# Patient Record
Sex: Male | Born: 1985 | Race: White | Hispanic: No | Marital: Single | Smoking: Current every day smoker
Health system: Southern US, Community
[De-identification: ages and names within clinical notes are randomized; demographics above are authoritative.]

## PROBLEM LIST (undated history)

## (undated) DIAGNOSIS — B019 Varicella without complication: Secondary | ICD-10-CM

## (undated) HISTORY — PX: NO PAST SURGERIES: SHX2092

## (undated) HISTORY — DX: Varicella without complication: B01.9

---

## 2016-01-26 ENCOUNTER — Ambulatory Visit: Payer: Self-pay

## 2016-05-10 ENCOUNTER — Ambulatory Visit (INDEPENDENT_AMBULATORY_CARE_PROVIDER_SITE_OTHER): Payer: BLUE CROSS/BLUE SHIELD | Admitting: Family Medicine

## 2016-05-10 ENCOUNTER — Encounter: Payer: Self-pay | Admitting: Family Medicine

## 2016-05-10 VITALS — BP 110/70 | HR 67 | Temp 98.0°F | Ht 72.0 in | Wt 171.0 lb

## 2016-05-10 DIAGNOSIS — Z23 Encounter for immunization: Secondary | ICD-10-CM | POA: Diagnosis not present

## 2016-05-10 DIAGNOSIS — Z0000 Encounter for general adult medical examination without abnormal findings: Secondary | ICD-10-CM | POA: Insufficient documentation

## 2016-05-10 LAB — CBC
HEMATOCRIT: 44.9 % (ref 39.0–52.0)
HEMOGLOBIN: 15.5 g/dL (ref 13.0–17.0)
MCHC: 34.5 g/dL (ref 30.0–36.0)
MCV: 93.1 fl (ref 78.0–100.0)
Platelets: 181 10*3/uL (ref 150.0–400.0)
RBC: 4.83 Mil/uL (ref 4.22–5.81)
RDW: 12.4 % (ref 11.5–15.5)
WBC: 5.2 10*3/uL (ref 4.0–10.5)

## 2016-05-10 LAB — COMPREHENSIVE METABOLIC PANEL
ALT: 11 U/L (ref 0–53)
AST: 17 U/L (ref 0–37)
Albumin: 4.7 g/dL (ref 3.5–5.2)
Alkaline Phosphatase: 42 U/L (ref 39–117)
BUN: 16 mg/dL (ref 6–23)
CHLORIDE: 104 meq/L (ref 96–112)
CO2: 28 meq/L (ref 19–32)
Calcium: 9.7 mg/dL (ref 8.4–10.5)
Creatinine, Ser: 1.12 mg/dL (ref 0.40–1.50)
GFR: 82.33 mL/min (ref >60.00)
GLUCOSE: 80 mg/dL (ref 70–99)
POTASSIUM: 4.2 meq/L (ref 3.5–5.1)
Sodium: 140 mEq/L (ref 135–145)
Total Bilirubin: 0.8 mg/dL (ref 0.2–1.2)
Total Protein: 6.7 g/dL (ref 6.0–8.3)

## 2016-05-10 LAB — LIPID PANEL
CHOL/HDL RATIO: 2
Cholesterol: 128 mg/dL (ref 0–200)
HDL: 56.2 mg/dL (ref >39.00)
LDL CALC: 53 mg/dL (ref 0–99)
NONHDL: 72.26
Triglycerides: 97 mg/dL (ref 0.0–149.0)
VLDL: 19.4 mg/dL (ref 0.0–40.0)

## 2016-05-10 LAB — HEMOGLOBIN A1C: Hgb A1c MFr Bld: 5.2 % (ref 4.6–6.5)

## 2016-05-10 NOTE — Progress Notes (Signed)
   Subjective:  Patient ID: Ronald Burns, male    DOB: 04-15-1986  Age: 30 y.o. MRN: [id number]  CC: Establish care/Annual physical exam.  HPI Ronald Burns is a 30 y.o. male presents to the clinic today to establish care and for an annual physical exam.  Preventative Healthcare  Immunizations  Tetanus - w/in last 10 years.   Flu - In need of.  Labs: Screening labs.  Alcohol use: Yes. See below.  Smoking/tobacco use: Vapes.  Regular dental exams: Yes.   PMH, Surgical Hx, Family Hx, Social History reviewed and updated as below.  Past Medical History:  Diagnosis Date  . Chicken pox    Past Surgical History:  Procedure Laterality Date  . NO PAST SURGERIES     Family History  Problem Relation Age of Onset  . Heart disease Father   . Diabetes Maternal Uncle   . Arthritis Maternal Grandmother   . Hypertension Maternal Grandmother   . Arthritis Maternal Grandfather   . Diabetes Maternal Grandfather   . Hypertension Maternal Grandfather   . Hypertension Paternal Grandmother   . Hypertension Paternal Grandfather    Social History  Substance Use Topics  . Smoking status: Current Every Day Smoker    Types: E-cigarettes  . Smokeless tobacco: Former Systems developer  . Alcohol use 3.6 oz/week    6 Standard drinks or equivalent per week    Review of Systems  Cardiovascular: Positive for palpitations.  Psychiatric/Behavioral:       Anxiety, stress.  All other systems reviewed and are negative.  Objective:   Today's Vitals: BP 110/70 (BP Location: Right Arm, Patient Position: Sitting, Cuff Size: Normal)   Pulse 67   Temp 98 F (36.7 C) (Oral)   Ht 6' (1.829 m)   Wt 171 lb (77.6 kg)   SpO2 100%   BMI 23.19 kg/m   Physical Exam  Constitutional: He is oriented to person, place, and time. He appears well-developed and well-nourished. No distress.  HENT:  Head: Normocephalic and atraumatic.  Nose: Nose normal.  Mouth/Throat: Oropharynx is clear and moist. No oropharyngeal  exudate.  Normal TM's bilaterally.   Eyes: Conjunctivae are normal. No scleral icterus.  Neck: Neck supple. No thyromegaly present.  Cardiovascular: Normal rate and regular rhythm.   No murmur heard. Pulmonary/Chest: Effort normal and breath sounds normal. He has no wheezes. He has no rales.  Abdominal: Soft. He exhibits no distension. There is no tenderness. There is no rebound and no guarding.  Musculoskeletal: Normal range of motion. He exhibits no edema.  Lymphadenopathy:    He has no cervical adenopathy.  Neurological: He is alert and oriented to person, place, and time.  Skin: Skin is warm and dry. No rash noted.  Psychiatric: He has a normal mood and affect.  Vitals reviewed.  Assessment & Plan:   Problem List Items Addressed This Visit    Annual physical exam - Primary    Tetanus up to date. Flu vaccine given today. Screening labs today.      Relevant Orders   CBC   Comp Met (CMET)   Lipid Profile   HgB A1c    Other Visit Diagnoses    Encounter for immunization       Relevant Orders   Flu Vaccine QUAD 36+ mos IM (Completed)     Follow-up: Return in about 1 year (around 05/10/2017).  [place]

## 2016-05-10 NOTE — Patient Instructions (Addendum)
It was nice to see you today.  Follow up:  No Follow-up on file.  Take care  Dr. Adriana Simasook   Health Maintenance, Male A healthy lifestyle and preventative care can promote health and wellness.  Maintain regular health, dental, and eye exams.  Eat a healthy diet. Foods like vegetables, fruits, whole grains, low-fat dairy products, and lean protein foods contain the nutrients you need and are low in calories. Decrease your intake of foods high in solid fats, added sugars, and salt. Get information about a proper diet from your health care provider, if necessary.  Regular physical exercise is one of the most important things you can do for your health. Most adults should get at least 150 minutes of moderate-intensity exercise (any activity that increases your heart rate and causes you to sweat) each week. In addition, most adults need muscle-strengthening exercises on 2 or more days a week.   Maintain a healthy weight. The body mass index (BMI) is a screening tool to identify possible weight problems. It provides an estimate of body fat based on height and weight. Your health care provider can find your BMI and can help you achieve or maintain a healthy weight. For males 20 years and older:  A BMI below 18.5 is considered underweight.  A BMI of 18.5 to 24.9 is normal.  A BMI of 25 to 29.9 is considered overweight.  A BMI of 30 and above is considered obese.  Maintain normal blood lipids and cholesterol by exercising and minimizing your intake of saturated fat. Eat a balanced diet with plenty of fruits and vegetables. Blood tests for lipids and cholesterol should begin at age 29 and be repeated every 5 years. If your lipid or cholesterol levels are high, you are over age 29, or you are at high risk for heart disease, you may need your cholesterol levels checked more frequently.Ongoing high lipid and cholesterol levels should be treated with medicines if diet and exercise are not working.  If  you smoke, find out from your health care provider how to quit. If you do not use tobacco, do not start.  Lung cancer screening is recommended for adults aged 55-80 years who are at high risk for developing lung cancer because of a history of smoking. A yearly low-dose CT scan of the lungs is recommended for people who have at least a 30-pack-year history of smoking and are current smokers or have quit within the past 15 years. A pack year of smoking is smoking an average of 1 pack of cigarettes a day for 1 year (for example, a 30-pack-year history of smoking could mean smoking 1 pack a day for 30 years or 2 packs a day for 15 years). Yearly screening should continue until the smoker has stopped smoking for at least 15 years. Yearly screening should be stopped for people who develop a health problem that would prevent them from having lung cancer treatment.  If you choose to drink alcohol, do not have more than 2 drinks per day. One drink is considered to be 12 oz (360 mL) of beer, 5 oz (150 mL) of wine, or 1.5 oz (45 mL) of liquor.  Avoid the use of street drugs. Do not share needles with anyone. Ask for help if you need support or instructions about stopping the use of drugs.  High blood pressure causes heart disease and increases the risk of stroke. High blood pressure is more likely to develop in:  People who have blood pressure in  the end of the normal range (100-139/85-89 mm Hg).  People who are overweight or obese.  People who are African American.  If you are 73-75 years of age, have your blood pressure checked every 3-5 years. If you are 89 years of age or older, have your blood pressure checked every year. You should have your blood pressure measured twice--once when you are at a hospital or clinic, and once when you are not at a hospital or clinic. Record the average of the two measurements. To check your blood pressure when you are not at a hospital or clinic, you can use:  An automated  blood pressure machine at a pharmacy.  A home blood pressure monitor.  If you are 40-18 years old, ask your health care provider if you should take aspirin to prevent heart disease.  Diabetes screening involves taking a blood sample to check your fasting blood sugar level. This should be done once every 3 years after age 28 if you are at a normal weight and without risk factors for diabetes. Testing should be considered at a younger age or be carried out more frequently if you are overweight and have at least 1 risk factor for diabetes.  Colorectal cancer can be detected and often prevented. Most routine colorectal cancer screening begins at the age of 27 and continues through age 51. However, your health care provider may recommend screening at an earlier age if you have risk factors for colon cancer. On a yearly basis, your health care provider may provide home test kits to check for hidden blood in the stool. A small camera at the end of a tube may be used to directly examine the colon (sigmoidoscopy or colonoscopy) to detect the earliest forms of colorectal cancer. Talk to your health care provider about this at age 95 when routine screening begins. A direct exam of the colon should be repeated every 5-10 years through age 61, unless early forms of precancerous polyps or small growths are found.  People who are at an increased risk for hepatitis B should be screened for this virus. You are considered at high risk for hepatitis B if:  You were born in a country where hepatitis B occurs often. Talk with your health care provider about which countries are considered high risk.  Your parents were born in a high-risk country and you have not received a shot to protect against hepatitis B (hepatitis B vaccine).  You have HIV or AIDS.  You use needles to inject street drugs.  You live with, or have sex with, someone who has hepatitis B.  You are a man who has sex with other men (MSM).  You get  hemodialysis treatment.  You take certain medicines for conditions like cancer, organ transplantation, and autoimmune conditions.  Hepatitis C blood testing is recommended for all people born from [birth year] through 1964 and any individual with known risk factors for hepatitis C.  Healthy men should no longer receive prostate-specific antigen (PSA) blood tests as part of routine cancer screening. Talk to your health care provider about prostate cancer screening.  Testicular cancer screening is not recommended for adolescents or adult males who have no symptoms. Screening includes self-exam, a health care provider exam, and other screening tests. Consult with your health care provider about any symptoms you have or any concerns you have about testicular cancer.  Practice safe sex. Use condoms and avoid high-risk sexual practices to reduce the spread of sexually transmitted infections (STIs).  You  should be screened for STIs, including gonorrhea and chlamydia if:  You are sexually active and are younger than 24 years.  You are older than 24 years, and your health care provider tells you that you are at risk for this type of infection.  Your sexual activity has changed since you were last screened, and you are at an increased risk for chlamydia or gonorrhea. Ask your health care provider if you are at risk.  If you are at risk of being infected with HIV, it is recommended that you take a prescription medicine daily to prevent HIV infection. This is called pre-exposure prophylaxis (PrEP). You are considered at risk if:  You are a man who has sex with other men (MSM).  You are a heterosexual man who is sexually active with multiple partners.  You take drugs by injection.  You are sexually active with a partner who has HIV.  Talk with your health care provider about whether you are at high risk of being infected with HIV. If you choose to begin PrEP, you should first be tested for HIV. You should  then be tested every 3 months for as long as you are taking PrEP.  Use sunscreen. Apply sunscreen liberally and repeatedly throughout the day. You should seek shade when your shadow is shorter than you. Protect yourself by wearing long sleeves, pants, a wide-brimmed hat, and sunglasses year round whenever you are outdoors.  Tell your health care provider of new moles or changes in moles, especially if there is a change in shape or color. Also, tell your health care provider if a mole is larger than the size of a pencil eraser.  A one-time screening for abdominal aortic aneurysm (AAA) and surgical repair of large AAAs by ultrasound is recommended for men aged 34-75 years who are current or former smokers.  Stay current with your vaccines (immunizations).   This information is not intended to replace advice given to you by your health care provider. Make sure you discuss any questions you have with your health care provider.   Document Released: 12/29/2007 Document Revised: 07/23/2014 Document Reviewed: 11/27/2010 Elsevier Interactive Patient Education [employer].

## 2016-05-10 NOTE — Progress Notes (Signed)
Pre visit review using our clinic review tool, if applicable. No additional management support is needed unless otherwise documented below in the visit note. 

## 2016-05-10 NOTE — Assessment & Plan Note (Signed)
Tetanus up to date. Flu vaccine given today. Screening labs today.

## 2016-06-14 ENCOUNTER — Encounter: Payer: Self-pay | Admitting: Emergency Medicine

## 2016-06-14 ENCOUNTER — Emergency Department
Admission: EM | Admit: 2016-06-14 | Discharge: 2016-06-14 | Disposition: A | Discharge status: Home / Self Care | Payer: BLUE CROSS/BLUE SHIELD | Attending: Emergency Medicine | Admitting: Emergency Medicine

## 2016-06-14 DIAGNOSIS — F17210 Nicotine dependence, cigarettes, uncomplicated: Secondary | ICD-10-CM | POA: Diagnosis not present

## 2016-06-14 DIAGNOSIS — R197 Diarrhea, unspecified: Secondary | ICD-10-CM | POA: Diagnosis not present

## 2016-06-14 LAB — COMPREHENSIVE METABOLIC PANEL
ALT: 33 U/L (ref 17–63)
AST: 29 U/L (ref 15–41)
Albumin: 3.8 g/dL (ref 3.5–5.0)
Alkaline Phosphatase: 44 U/L (ref 38–126)
Anion gap: 7 (ref 5–15)
BUN: 11 mg/dL (ref 6–20)
CO2: 26 mmol/L (ref 22–32)
CREATININE: 1.11 mg/dL (ref 0.61–1.24)
Calcium: 9 mg/dL (ref 8.9–10.3)
Chloride: 106 mmol/L (ref 101–111)
GFR calc non Af Amer: >60 mL/min (ref >60)
Glucose, Bld: 110 mg/dL — ABNORMAL HIGH (ref 65–99)
POTASSIUM: 3.4 mmol/L — AB (ref 3.5–5.1)
SODIUM: 139 mmol/L (ref 135–145)
Total Bilirubin: 0.8 mg/dL (ref 0.3–1.2)
Total Protein: 6.4 g/dL — ABNORMAL LOW (ref 6.5–8.1)

## 2016-06-14 LAB — CBC
HCT: 44.4 % (ref 40.0–52.0)
Hemoglobin: 15.5 g/dL (ref 13.0–18.0)
MCH: 32.1 pg (ref 26.0–34.0)
MCHC: 34.9 g/dL (ref 32.0–36.0)
MCV: 91.9 fL (ref 80.0–100.0)
PLATELETS: 150 10*3/uL (ref 150–440)
RBC: 4.83 MIL/uL (ref 4.40–5.90)
RDW: 12.3 % (ref 11.5–14.5)
WBC: 5.3 10*3/uL (ref 3.8–10.6)

## 2016-06-14 LAB — URINALYSIS COMPLETE WITH MICROSCOPIC (ARMC ONLY)
BILIRUBIN URINE: NEGATIVE
Bacteria, UA: NONE SEEN
Glucose, UA: NEGATIVE mg/dL
HGB URINE DIPSTICK: NEGATIVE
KETONES UR: NEGATIVE mg/dL
LEUKOCYTES UA: NEGATIVE
NITRITE: NEGATIVE
PH: 7 (ref 5.0–8.0)
Protein, ur: NEGATIVE mg/dL
SPECIFIC GRAVITY, URINE: 1.008 (ref 1.005–1.030)

## 2016-06-14 LAB — C DIFFICILE QUICK SCREEN W PCR REFLEX
C DIFFICILE (CDIFF) INTERP: NOT DETECTED
C Diff antigen: NEGATIVE
C Diff toxin: NEGATIVE

## 2016-06-14 LAB — GASTROINTESTINAL PANEL BY PCR, STOOL (REPLACES STOOL CULTURE)
Adenovirus F40/41: NOT DETECTED
Astrovirus: DETECTED — AB
CRYPTOSPORIDIUM: NOT DETECTED
CYCLOSPORA CAYETANENSIS: NOT DETECTED
Campylobacter species: NOT DETECTED
ENTEROAGGREGATIVE E COLI (EAEC): NOT DETECTED
Entamoeba histolytica: NOT DETECTED
Enteropathogenic E coli (EPEC): NOT DETECTED
Enterotoxigenic E coli (ETEC): NOT DETECTED
GIARDIA LAMBLIA: NOT DETECTED
Norovirus GI/GII: NOT DETECTED
PLESIMONAS SHIGELLOIDES: NOT DETECTED
ROTAVIRUS A: NOT DETECTED
SALMONELLA SPECIES: NOT DETECTED
SHIGELLA/ENTEROINVASIVE E COLI (EIEC): NOT DETECTED
Sapovirus (I, II, IV, and V): NOT DETECTED
Shiga like toxin producing E coli (STEC): NOT DETECTED
Vibrio cholerae: NOT DETECTED
Vibrio species: NOT DETECTED
YERSINIA ENTEROCOLITICA: NOT DETECTED

## 2016-06-14 LAB — LIPASE, BLOOD: LIPASE: 26 U/L (ref 11–51)

## 2016-06-14 NOTE — ED Provider Notes (Signed)
Patient received in signout from Dr. Dolores FrameSung pending stool studies. Presentation most consistent with viral enteritis. Patient remains he medically stable. Patient provided reassert pins and follow-up with PCP.  Patient was able to tolerate PO and was able to ambulate with a steady gait.  Have discussed with the patient and available family all diagnostics and treatments performed thus far and all questions were answered to the best of my ability. The patient demonstrates understanding and agreement with plan.    Willy EddyPatrick Sharetta Ricchio, MD 06/14/16 [phone]

## 2016-06-14 NOTE — Discharge Instructions (Signed)
1. BRAT diet 3 days, then slowly advance diet as tolerated. 2. Return to the ER for worsening symptoms, persistent vomiting, fever or other concerns.

## 2016-06-14 NOTE — ED Provider Notes (Signed)
[facility] Emergency Department Provider Note   ____________________________________________   First MD Initiated Contact with Patient 06/14/16 [phone]     (approximate)  I have reviewed the triage vital signs and the nursing notes.   HISTORY  Chief Complaint Diarrhea    HPI Ronald Burns is a 30 y.o. male who presents to the ED from home with a chief complaint of diarrhea. Patient reports a 2 day history of loose and watery stools. Reports approximately 3-5 loose stools daily. Symptoms are associated with abdominal cramping. Denies associated fever, chills, chest pain, shortness of breath, nausea, vomiting, dysuria. Denies recent travel or trauma. He thinks he may have eaten some bad food. Nothing makes his symptoms better or worse.   Past Medical History:  Diagnosis Date  . Chicken pox     Patient Active Problem List   Diagnosis Date Noted  . Annual physical exam 05/10/2016    Past Surgical History:  Procedure Laterality Date  . NO PAST SURGERIES      Prior to Admission medications   Not on File    Allergies Patient has no known allergies.  Family History  Problem Relation Age of Onset  . Heart disease Father   . Diabetes Maternal Uncle   . Arthritis Maternal Grandmother   . Hypertension Maternal Grandmother   . Arthritis Maternal Grandfather   . Diabetes Maternal Grandfather   . Hypertension Maternal Grandfather   . Hypertension Paternal Grandmother   . Hypertension Paternal Grandfather     Social History Social History  Substance Use Topics  . Smoking status: Current Every Day Smoker    Types: E-cigarettes  . Smokeless tobacco: Former NeurosurgeonUser  . Alcohol use 3.6 oz/week    6 Standard drinks or equivalent per week    Review of Systems  Constitutional: No fever/chills. Eyes: No visual changes. ENT: No sore throat. Cardiovascular: Denies chest pain. Respiratory: Denies shortness of breath. Gastrointestinal: No abdominal  pain.  No nausea, no vomiting.  Positive for diarrhea.  No constipation. Genitourinary: Negative for dysuria. Musculoskeletal: Negative for back pain. Skin: Negative for rash. Neurological: Negative for headaches, focal weakness or numbness.  10-point ROS otherwise negative.  ____________________________________________   PHYSICAL EXAM:  VITAL SIGNS: ED Triage Vitals  Enc Vitals Group     BP 06/14/16 0349 137/63     Pulse Rate 06/14/16 0349 76     Resp 06/14/16 0349 18     Temp 06/14/16 0349 98.2 F (36.8 C)     Temp Source 06/14/16 0349 Oral     SpO2 06/14/16 0349 100 %     Weight 06/14/16 0350 178 lb (80.7 kg)     Height 06/14/16 0350 6' (1.829 m)     Head Circumference --      Peak Flow --      Pain Score 06/14/16 0349 4     Pain Loc --      Pain Edu? --      Excl. in GC? --     Constitutional: Alert and oriented. Well appearing and in no acute distress. Eyes: Conjunctivae are normal. PERRL. EOMI. Head: Atraumatic. Nose: No congestion/rhinnorhea. Mouth/Throat: Mucous membranes are moist.  Oropharynx non-erythematous. Neck: No stridor.   Cardiovascular: Normal rate, regular rhythm. Grossly normal heart sounds.  Good peripheral circulation. Respiratory: Normal respiratory effort.  No retractions. Lungs CTAB. Gastrointestinal: Soft and nontender to light and deep palpation. No distention. No abdominal bruits. No CVA tenderness. Musculoskeletal: No lower extremity tenderness nor edema.  No joint effusions. Neurologic:  Normal speech and language. No gross focal neurologic deficits are appreciated. No gait instability. Skin:  Skin is warm, dry and intact. No rash noted. Psychiatric: Mood and affect are normal. Speech and behavior are normal.  ____________________________________________   LABS (all labs ordered are listed, but only abnormal results are displayed)  Labs Reviewed  COMPREHENSIVE METABOLIC PANEL - Abnormal; Notable for the following:       Result Value    Potassium 3.4 (*)    Glucose, Bld 110 (*)    Total Protein 6.4 (*)    All other components within normal limits  URINALYSIS COMPLETEWITH MICROSCOPIC ([facility] ONLY) - Abnormal; Notable for the following:    Color, Urine STRAW (*)    APPearance CLEAR (*)    Squamous Epithelial / LPF 0-5 (*)    All other components within normal limits  C DIFFICILE QUICK SCREEN W PCR REFLEX  GASTROINTESTINAL PANEL BY PCR, STOOL (REPLACES STOOL CULTURE)  LIPASE, BLOOD  CBC   ____________________________________________  EKG  None ____________________________________________  RADIOLOGY  None ____________________________________________   PROCEDURES  Procedure(s) performed: None  Procedures  Critical Care performed: No  ____________________________________________   INITIAL IMPRESSION / ASSESSMENT AND PLAN / ED COURSE  Pertinent labs & imaging results that were available during my care of the patient were reviewed by me and considered in my medical decision making (see chart for details).  30 year old male who presents with a 2 day history of diarrhea. Laboratory urinalysis results are unremarkable. Patient had loose watery stool in the treatment room prior to my exam. Will obtain stool cultures if patient can produce. Clinically patient is well-appearing with a benign abdominal exam; will hold CT scan for now.  Clinical Course as of Jun 15 711  Thu Jun 14, 2016  [id number] Stool cultures are pending. Care transferred to Dr. Roxan Hockeyobinson pending results.  [JS]    Clinical Course User Index [JS] Irean HongJade J Sung, MD     ____________________________________________   FINAL CLINICAL IMPRESSION(S) / ED DIAGNOSES  Final diagnoses:  Diarrhea, unspecified type      NEW MEDICATIONS STARTED DURING THIS VISIT:  New Prescriptions   No medications on file     Note:  This document was prepared using Dragon voice recognition software and may include unintentional dictation errors.    Irean HongJade  J Sung, MD 06/14/16 [phone]

## 2016-06-14 NOTE — ED Triage Notes (Signed)
Patient ambulatory to triage with steady gait, without difficulty or distress noted; pt reports watery stools x 2 days with abd cramping and chills

## 2017-02-18 ENCOUNTER — Encounter: Payer: Self-pay | Admitting: Family Medicine

## 2017-05-13 ENCOUNTER — Encounter: Payer: BLUE CROSS/BLUE SHIELD | Admitting: Family Medicine

## 2017-07-14 DIAGNOSIS — M545 Low back pain: Secondary | ICD-10-CM | POA: Diagnosis not present

## 2018-12-16 DIAGNOSIS — M545 Low back pain: Secondary | ICD-10-CM | POA: Diagnosis not present

## 2018-12-22 ENCOUNTER — Encounter: Payer: Self-pay | Admitting: Family Medicine

## 2018-12-22 ENCOUNTER — Ambulatory Visit (INDEPENDENT_AMBULATORY_CARE_PROVIDER_SITE_OTHER): Payer: BC Managed Care – PPO

## 2018-12-22 ENCOUNTER — Ambulatory Visit (INDEPENDENT_AMBULATORY_CARE_PROVIDER_SITE_OTHER): Payer: BC Managed Care – PPO | Admitting: Family Medicine

## 2018-12-22 ENCOUNTER — Other Ambulatory Visit: Payer: Self-pay

## 2018-12-22 VITALS — BP 100/62 | HR 77 | Temp 97.7°F | Resp 16 | Ht 72.0 in | Wt 194.8 lb

## 2018-12-22 DIAGNOSIS — M5442 Lumbago with sciatica, left side: Secondary | ICD-10-CM

## 2018-12-22 DIAGNOSIS — M62830 Muscle spasm of back: Secondary | ICD-10-CM | POA: Diagnosis not present

## 2018-12-22 DIAGNOSIS — G8929 Other chronic pain: Secondary | ICD-10-CM | POA: Diagnosis not present

## 2018-12-22 DIAGNOSIS — M4186 Other forms of scoliosis, lumbar region: Secondary | ICD-10-CM | POA: Diagnosis not present

## 2018-12-22 MED ORDER — CYCLOBENZAPRINE HCL 10 MG PO TABS: 10.0000 mg | ORAL_TABLET | Freq: Three times a day (TID) | ORAL | 0 refills | Status: AC | PRN

## 2018-12-22 MED ORDER — PREDNISONE 10 MG (21) PO TBPK: ORAL_TABLET | ORAL | 0 refills | Status: AC

## 2018-12-22 NOTE — Patient Instructions (Signed)
Back Exercises If you have pain in your back, do these exercises 2-3 times each day or as told by your doctor. When the pain goes away, do the exercises once each day, but repeat the steps more times for each exercise (do more repetitions). If you do not have pain in your back, do these exercises once each day or as told by your doctor. Exercises Single Knee to Chest Do these steps 3-5 times in a row for each leg: 1. Lie on your back on a firm bed or the floor with your legs stretched out. 2. Bring one knee to your chest. 3. Hold your knee to your chest by grabbing your knee or thigh. 4. Pull on your knee until you feel a gentle stretch in your lower back. 5. Keep doing the stretch for 10-30 seconds. 6. Slowly let go of your leg and straighten it. Pelvic Tilt Do these steps 5-10 times in a row: 1. Lie on your back on a firm bed or the floor with your legs stretched out. 2. Bend your knees so they point up to the ceiling. Your feet should be flat on the floor. 3. Tighten your lower belly (abdomen) muscles to press your lower back against the floor. This will make your tailbone point up to the ceiling instead of pointing down to your feet or the floor. 4. Stay in this position for 5-10 seconds while you gently tighten your muscles and breathe evenly. Cat-Cow Do these steps until your lower back bends more easily: 1. Get on your hands and knees on a firm surface. Keep your hands under your shoulders, and keep your knees under your hips. You may put padding under your knees. 2. Let your head hang down, and make your tailbone point down to the floor so your lower back is round like the back of a cat. 3. Stay in this position for 5 seconds. 4. Slowly lift your head and make your tailbone point up to the ceiling so your back hangs low (sags) like the back of a cow. 5. Stay in this position for 5 seconds.  Press-Ups Do these steps 5-10 times in a row: 1. Lie on your belly (face-down) on the floor.  2. Place your hands near your head, about shoulder-width apart. 3. While you keep your back relaxed and keep your hips on the floor, slowly straighten your arms to raise the top half of your body and lift your shoulders. Do not use your back muscles. To make yourself more comfortable, you may change where you place your hands. 4. Stay in this position for 5 seconds. 5. Slowly return to lying flat on the floor.  Bridges Do these steps 10 times in a row: 1. Lie on your back on a firm surface. 2. Bend your knees so they point up to the ceiling. Your feet should be flat on the floor. 3. Tighten your butt muscles and lift your butt off of the floor until your waist is almost as high as your knees. If you do not feel the muscles working in your butt and the back of your thighs, slide your feet 1-2 inches farther away from your butt. 4. Stay in this position for 3-5 seconds. 5. Slowly lower your butt to the floor, and let your butt muscles relax. If this exercise is too easy, try doing it with your arms crossed over your chest. Belly Crunches Do these steps 5-10 times in a row: 1. Lie on your back on a  firm bed or the floor with your legs stretched out. 2. Bend your knees so they point up to the ceiling. Your feet should be flat on the floor. 3. Cross your arms over your chest. 4. Tip your chin a little bit toward your chest but do not bend your neck. 5. Tighten your belly muscles and slowly raise your chest just enough to lift your shoulder blades a tiny bit off of the floor. 6. Slowly lower your chest and your head to the floor. Back Lifts Do these steps 5-10 times in a row: 1. Lie on your belly (face-down) with your arms at your sides, and rest your forehead on the floor. 2. Tighten the muscles in your legs and your butt. 3. Slowly lift your chest off of the floor while you keep your hips on the floor. Keep the back of your head in line with the curve in your back. Look at the floor while you  do this. 4. Stay in this position for 3-5 seconds. 5. Slowly lower your chest and your face to the floor. Contact a doctor if:  Your back pain gets a lot worse when you do an exercise.  Your back pain does not lessen 2 hours after you exercise. If you have any of these problems, stop doing the exercises. Do not do them again unless your doctor says it is okay. Get help right away if:  You have sudden, very bad back pain. If this happens, stop doing the exercises. Do not do them again unless your doctor says it is okay. This information is not intended to replace advice given to you by your health care provider. Make sure you discuss any questions you have with your health care provider. Document Released: 08/04/2010 Document Revised: 03/26/2018 Document Reviewed: 08/26/2014 Elsevier Interactive Patient Education  2019 [employer]. Core Strength Exercises  Core exercises help to build strength in the muscles between your ribs and your hips (abdominal muscles). These muscles help to support your body and keep your spine stable. It is important to maintain strength in your core to prevent injury and pain. Some activities, such as yoga and Pilates, can help to strengthen core muscles. You can also strengthen core muscles with exercises at home. It is important to talk to your health care provider before you start a new exercise routine. What are the benefits of core strength exercises? Core strength exercises can:  Reduce back pain.  Help to rebuild strength after a back or spine injury.  Help to prevent injury during physical activity, especially injuries to the back and knees. How to do core strength exercises Repeat these exercises 10-15 times, or until you are tired. Do exercises exactly as told by your health care provider and adjust them as directed. It is normal to feel mild stretching, pulling, tightness, or discomfort as you do these exercises. If you feel any pain while doing  these exercises, stop. If your pain continues or gets worse when doing core exercises, contact your health care provider. You may want to use a padded yoga or exercise mat for strength exercises that are done on the floor. Bridging  5. Lie on your back on a firm surface with your knees bent and your feet flat on the floor. 6. Raise your hips so that your knees, hips, and shoulders form a straight line together. Keep your abdominal muscles tight. 7. Hold this position for 3-5 seconds. 8. Slowly lower your hips to the starting position. 9. Let your  muscles relax completely between repetitions. Single-leg bridge 6. Lie on your back on a firm surface with your knees bent and your feet flat on the floor. 7. Raise your hips so that your knees, hips, and shoulders form a straight line together. Keep your abdominal muscles tight. 8. Lift one foot off the floor, then completely straighten that leg. 9. Hold this position for 3-5 seconds. 10. Put the straight leg back down in the bent position. 11. Slowly lower your hips to the starting position. 12. Repeat these steps using your other leg. Side bridge 6. Lie on your side with your knees bent. Prop yourself up on the elbow that is near the floor. 7. Using your abdominal muscles and your elbow that is on the floor, raise your body off the floor. Raise your hip so that your shoulder, hip, and foot form a straight line together. 8. Hold this position for 10 seconds. Keep your head and neck raised and away from your shoulder (in their normal, neutral position). Keep your abdominal muscles tight. 9. Slowly lower your hip to the starting position. 10. Repeat and try to hold this position longer, working your way up to 30 seconds. Abdominal crunch 6. Lie on your back on a firm surface. Bend your knees and keep your feet flat on the floor. 7. Cross your arms over your chest. 8. Without bending your neck, tip your chin slightly toward your chest. 9. Tighten  your abdominal muscles as you lift your chest just high enough to lift your shoulder blades off of the floor. Do not hold your breath. You can do this with short lifts or long lifts. 10. Slowly return to the starting position. Bird dog 7. Get on your hands and knees, with your legs shoulder-width apart and your arms under your shoulders. Keep your back straight. 8. Tighten your abdominal muscles. 9. Raise one of your legs off the floor and straighten it. Try to keep it parallel to the floor. 10. Slowly lower your leg to the starting position. 11. Raise one of your arms off the floor and straighten it. Try to keep it parallel to the floor. 12. Slowly lower your arm to the starting position. 13. Repeat with the other arm and leg. If possible, try raising a leg and arm at the same time, on opposite sides of the body. For example, raise your left hand and your right leg. Plank 6. Lie on your belly. 7. Prop up your body onto your forearms and your feet, keeping your legs straight. Your body should make a straight line between your shoulders and feet. 8. Hold this position for 10 seconds while keeping your abdominal muscles tight. 9. Lower your body to the starting position. 10. Repeat and try to hold this position longer, working your way up to 30 seconds. Cross-core strengthening 1. Stand with your feet shoulder-width apart. 2. Hold a ball out in front of you. Keep your arms straight. 3. Tighten your abdominal muscles and slowly rotate at your waist from side to side. Keep your feet flat. 4. Once you are comfortable, try repeating this exercise with a heavier ball. Top core strengthening 1. Stand about 18 inches (46 cm) in front of a wall, with your back to the wall. 2. Keep your feet flat and shoulder-width apart. 3. Tighten your abdominal muscles. 4. Bend your hips and knees. 5. Slowly reach between your legs to touch the wall behind you. 6. Slowly stand back up. 7. Raise your arms over  your head and reach behind you. 8. Return to the starting position. General tips  Do not do any exercises that cause pain. If you have pain while exercising, talk to your health care provider.  Always stretch before and after doing these exercises. This can help prevent injury.  Maintain a healthy weight. Ask your health care provider what weight is healthy for you. Contact a health care provider if:  You have back pain that gets worse or does not go away.  You feel pain while doing core strength exercises. Get help right away if:  You have severe pain that does not get better with medicine. Summary  Core exercises help to build strength in the muscles between your ribs and your waist.  Core muscles help to support your body and keep your spine stable.  Some activities, such as yoga and Pilates, can help to strengthen core muscles.  Core strength exercises can help back pain and can prevent injury.  If you feel any pain while doing core strength exercises, stop. This information is not intended to replace advice given to you by your health care provider. Make sure you discuss any questions you have with your health care provider. Document Released: 11/21/2016 Document Revised: 11/21/2016 Document Reviewed: 11/21/2016 Elsevier Interactive Patient Education  2019 [employer]  

## 2018-12-22 NOTE — Progress Notes (Signed)
Subjective:    Patient ID: Ronald Burns, male    DOB: 1986-02-23, 33 y.o.   MRN: [id number]  HPI   Patient presents to clinic due to flareup in low back pain.  Patient has been dealing with back pain off and on for 6 or 7 years.  Patient's job does require a lot of lifting and wonders if that puts a strain on his back.  He has been wearing a back brace as a preventative measure to help him at work and always tries to lift with his legs and use the tools available to him at his job to lift heavy items so he does not strain back.  Patient went to the urgent care about a month ago due to flareup of back pain and was given a IM injection of steroid.  States the injection helped for couple of days, but pain slowly returned.  States at times his left leg will feel like it is asleep.  Denies saddle anesthesia denies loss of bowel or bladder control.  States when he sits in 1 position for extended period of time he notices pain begin to build, also noticed pain a lot when he rode a horse and could not tolerate sitting on horse for long period of time.  Patient has never had imaging of low back.  Patient Active Problem List   Diagnosis Date Noted  . Annual physical exam 05/10/2016   Social History   Tobacco Use  . Smoking status: Current Every Day Smoker    Types: E-cigarettes  . Smokeless tobacco: Former Network engineer Use Topics  . Alcohol use: Yes    Alcohol/week: 6.0 standard drinks    Types: 6 Standard drinks or equivalent per week   Review of Systems  Constitutional: Negative for chills, fatigue and fever.  HENT: Negative for congestion, ear pain, sinus pain and sore throat.   Eyes: Negative.   Respiratory: Negative for cough, shortness of breath and wheezing.   Cardiovascular: Negative for chest pain, palpitations and leg swelling.  Gastrointestinal: Negative for abdominal pain, diarrhea, nausea and vomiting.  Genitourinary: Negative for dysuria, frequency and urgency.   Musculoskeletal: +low back pain Skin: Negative for color change, pallor and rash.  Neurological: Negative for syncope, light-headedness and headaches.  Psychiatric/Behavioral: The patient is not nervous/anxious.       Objective:   Physical Exam Vitals signs and nursing note reviewed.  Constitutional:      General: He is not in acute distress.    Appearance: He is not toxic-appearing.  HENT:     Head: Normocephalic and atraumatic.  Eyes:     General: No scleral icterus.    Extraocular Movements: Extraocular movements intact.     Pupils: Pupils are equal, round, and reactive to light.  Neck:     Musculoskeletal: Normal range of motion and neck supple. No neck rigidity.  Cardiovascular:     Rate and Rhythm: Normal rate and regular rhythm.     Heart sounds: Normal heart sounds.  Pulmonary:     Effort: Pulmonary effort is normal. No respiratory distress.     Breath sounds: Normal breath sounds.  Musculoskeletal:     Lumbar back: He exhibits tenderness.       Back:     Right lower leg: No edema.     Left lower leg: No edema.     Comments: Generalized tenderness across entire low back.  Left side more so than right.  Feels pain and pulling with straight  leg raises, left side more than right.  Skin:    General: Skin is warm and dry.     Coloration: Skin is not jaundiced or pale.  Neurological:     Mental Status: He is alert and oriented to person, place, and time.  Psychiatric:        Mood and Affect: Mood normal.        Behavior: Behavior normal.    Vitals:   12/22/18 1120  BP: 100/62  Pulse: 77  Resp: 16  Temp: 97.7 F (36.5 C)  SpO2: 98%     Assessment & Plan:    A total of 25  minutes were spent face-to-face with the patient during this encounter and over half of that time was spent on counseling and coordination of care. The patient was counseled on back pain treatments, imaging we are ordering, PT, wearing brace at work, proper lifitng    Left-sided low back  pain, muscle spasm-we will get imaging of back to further investigate pain and also we will order MRI due to the description of patient's pain sounding like there might be some nerve involvement.  Patient will take oral steroid taper and also muscle relaxer as needed.  Discussed use of heating pad on sore area to help relieve pain and also topical rub like BenGay or Biofreeze.  Patient given handout outlining multiple exercises to help improve low back pain and we also did place physical therapy referral.  Patient advised that if pain is not improving in the next few weeks and he does not hear anything about his MRI or physical therapy referral to call and let me know.   Patient otherwise asked to follow-up annually for physical exam and whenever needed.  Made patient aware that flu shots will be available in September 2020

## 2019-01-11 ENCOUNTER — Other Ambulatory Visit: Payer: Self-pay

## 2019-01-11 ENCOUNTER — Ambulatory Visit
Admission: RE | Admit: 2019-01-11 | Discharge: 2019-01-11 | Disposition: A | Discharge status: Home / Self Care | Payer: BC Managed Care – PPO | Source: Ambulatory Visit | Attending: Family Medicine | Admitting: Family Medicine

## 2019-01-11 DIAGNOSIS — G8929 Other chronic pain: Secondary | ICD-10-CM | POA: Diagnosis not present

## 2019-01-11 DIAGNOSIS — M62830 Muscle spasm of back: Secondary | ICD-10-CM | POA: Diagnosis not present

## 2019-01-11 DIAGNOSIS — M5442 Lumbago with sciatica, left side: Secondary | ICD-10-CM | POA: Diagnosis not present

## 2019-01-11 DIAGNOSIS — M545 Low back pain: Secondary | ICD-10-CM | POA: Diagnosis not present

## 2019-01-15 ENCOUNTER — Encounter: Payer: Self-pay | Admitting: Family Medicine

## 2019-01-15 ENCOUNTER — Ambulatory Visit: Payer: BC Managed Care – PPO | Admitting: Family Medicine

## 2019-01-15 ENCOUNTER — Ambulatory Visit (INDEPENDENT_AMBULATORY_CARE_PROVIDER_SITE_OTHER): Payer: BC Managed Care – PPO | Admitting: Family Medicine

## 2019-01-15 ENCOUNTER — Other Ambulatory Visit: Payer: Self-pay

## 2019-01-15 VITALS — BP 100/62 | HR 80 | Temp 97.8°F | Resp 16 | Ht 72.0 in | Wt 190.0 lb

## 2019-01-15 DIAGNOSIS — G8929 Other chronic pain: Secondary | ICD-10-CM | POA: Diagnosis not present

## 2019-01-15 DIAGNOSIS — R200 Anesthesia of skin: Secondary | ICD-10-CM

## 2019-01-15 DIAGNOSIS — M5442 Lumbago with sciatica, left side: Secondary | ICD-10-CM | POA: Diagnosis not present

## 2019-01-15 NOTE — Patient Instructions (Addendum)
STRETCH!  Definitely do physical therapy sessions  Neurosurgery referral placed for you

## 2019-01-15 NOTE — Progress Notes (Signed)
Subjective:    Patient ID: Ronald Burns, male    DOB: May 25, 1986, 33 y.o.   MRN: [id number]  HPI   Patient presents to clinic to follow-up on low back pain and numbness happening in legs.  Patient states he originally was having his numbness going down the left leg, but at times would notice numbness in both legs now.  Usually the numbness in both legs will occur after sitting for extended period of time, but then will go away after he gets up and stretches.  MRI was performed of lumbar spine which did show some disc degeneration at the L5-S1 region and a small protrusion.  Physical therapy referral has been placed and patient is working on getting this set up.  Denies loss of bowel or bladder control.  Denies saddle anesthesia.  States using ibuprofen as needed usually does help calm down the pain & back did feel improved when he took steroid taper.   Patient Active Problem List   Diagnosis Date Noted  . Annual physical exam 05/10/2016   Social History   Tobacco Use  . Smoking status: Current Every Day Smoker    Types: E-cigarettes  . Smokeless tobacco: Former Network engineer Use Topics  . Alcohol use: Yes    Alcohol/week: 6.0 standard drinks    Types: 6 Standard drinks or equivalent per week   Review of Systems  Constitutional: Negative for chills, fatigue and fever.  HENT: Negative for congestion, ear pain, sinus pain and sore throat.   Eyes: Negative.   Respiratory: Negative for cough, shortness of breath and wheezing.   Cardiovascular: Negative for chest pain, palpitations and leg swelling.  Gastrointestinal: Negative for abdominal pain, diarrhea, nausea and vomiting.  Genitourinary: Negative for dysuria, frequency and urgency.  Musculoskeletal: +low back pain  Skin: Negative for color change, pallor and rash.  Neurological: Negative for syncope, light-headedness and headaches.  Psychiatric/Behavioral: The patient is not nervous/anxious.       Objective:   Physical  Exam Vitals signs and nursing note reviewed.  Constitutional:      General: He is not in acute distress.    Appearance: He is normal weight. He is not ill-appearing, toxic-appearing or diaphoretic.  HENT:     Head: Normocephalic and atraumatic.  Eyes:     General: No scleral icterus.    Extraocular Movements: Extraocular movements intact.     Pupils: Pupils are equal, round, and reactive to light.  Neck:     Musculoskeletal: Normal range of motion and neck supple. No neck rigidity.  Cardiovascular:     Rate and Rhythm: Normal rate and regular rhythm.  Pulmonary:     Breath sounds: Normal breath sounds.  Musculoskeletal:       Back:     Right lower leg: No edema.     Left lower leg: No edema.     Comments: Straight leg raise does not cause pain in low back.  Tenderness across low back with palpation.  Quadricep strength equal and strong.  Dorsi plantarflexion equal and strong.  Gait is normal, no limping.  Able to bend forward backward side to side and twist side to side  Skin:    General: Skin is warm and dry.     Coloration: Skin is not jaundiced or pale.  Neurological:     Mental Status: He is alert and oriented to person, place, and time.  Psychiatric:        Mood and Affect: Mood normal.  Behavior: Behavior normal.    Vitals:   01/15/19 0826  BP: 100/62  Pulse: 80  Resp: 16  Temp: 97.8 F (36.6 C)  SpO2: 98%      Assessment & Plan:    Chronic low back pain, leg numbness - MRI of lumbar spine does show disc degeneration and small disc protrusion this most likely contributes to patient's pain.  Due to having the numbness happening in legs especially when standing and seated position for a long time, I have placed neurosurgery referral for patient.  Also recommend he get up and do stretching multiple times throughout the day to keep body and motion and avoid becoming stiff.  Also advised to use ibuprofen as needed for pain, wear back brace when at work to add  support to spine and lift properly by using legs rather than back.  Advised patient if he does not hear anything about neurosurgery referral in the next 7 days to let us know.  Patient already has physical therapy information and plans to get appointment set up with them soon.

## 2020-07-13 DIAGNOSIS — J209 Acute bronchitis, unspecified: Secondary | ICD-10-CM | POA: Diagnosis not present

## 2020-07-13 DIAGNOSIS — Z03818 Encounter for observation for suspected exposure to other biological agents ruled out: Secondary | ICD-10-CM | POA: Diagnosis not present

## 2020-07-13 DIAGNOSIS — U071 COVID-19: Secondary | ICD-10-CM | POA: Diagnosis not present

## 2020-07-13 DIAGNOSIS — J019 Acute sinusitis, unspecified: Secondary | ICD-10-CM | POA: Diagnosis not present

## 2020-08-01 IMAGING — MR MRI LUMBAR SPINE WITHOUT CONTRAST
5 series · 32 of 48 positions shown · non-contrast
Comparison: Radiography 12/22/2018

CLINICAL DATA: One month of central and bilateral low back pain
with bilateral leg numbness

EXAM:
MRI LUMBAR SPINE WITHOUT CONTRAST
TECHNIQUE: Multiplanar, multisequence MR imaging of the lumbar spine was
performed. No intravenous contrast was administered.

[Series 5: T2 · sagittal · 4.0mm · 0.81mm/px · 6 of 17 slices shown (1 of 2)]
[im 1/17]
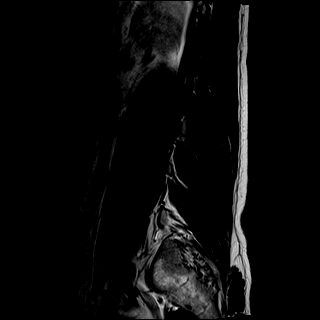
[im 4/17]
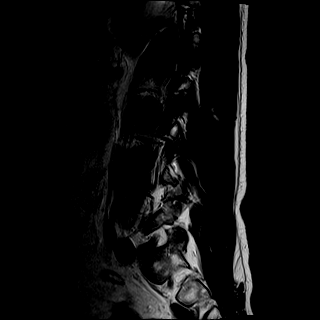
[im 7/17]
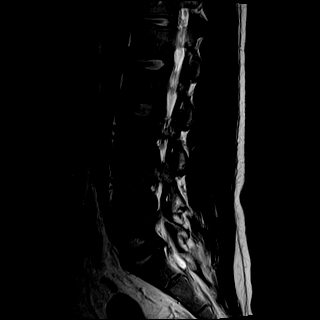
[im 10/17]
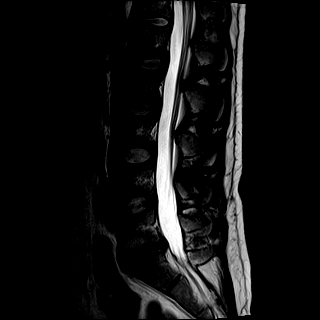
[im 13/17]
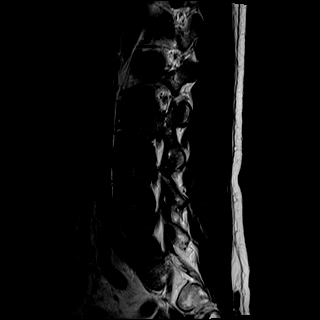
[im 17/17]
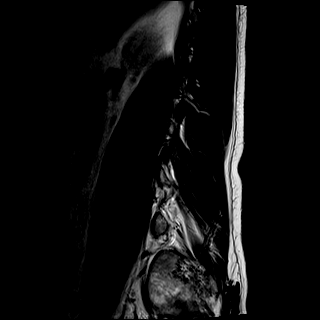

[Series 6: T1 · sagittal · 4.0mm · 0.81mm/px · 6 of 17 slices shown (1 of 2)]
[im 1/17]
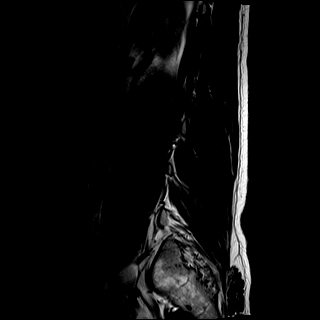
[im 4/17]
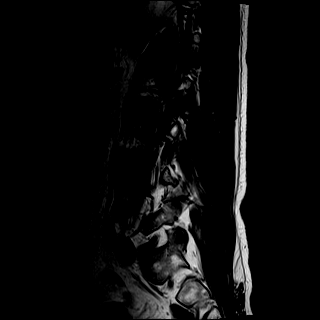
[im 7/17]
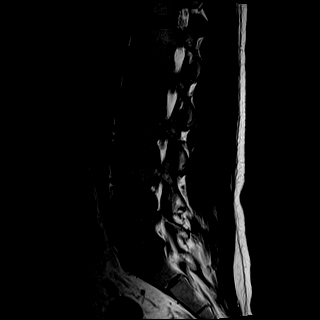
[im 10/17]
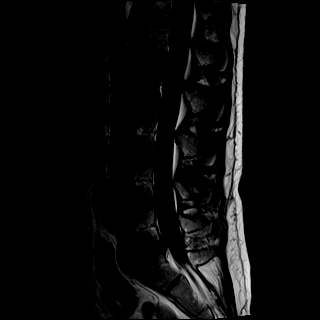
[im 13/17]
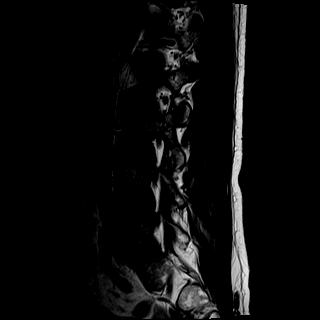
[im 17/17]
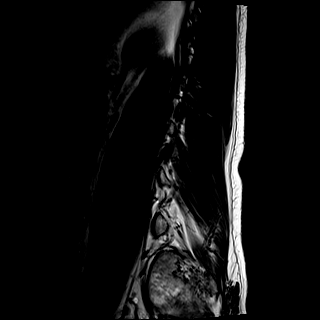

[Series 7: STIR · sagittal · 4.0mm · 0.41mm/px · 2 of 17 slices shown]
[im 1/17]
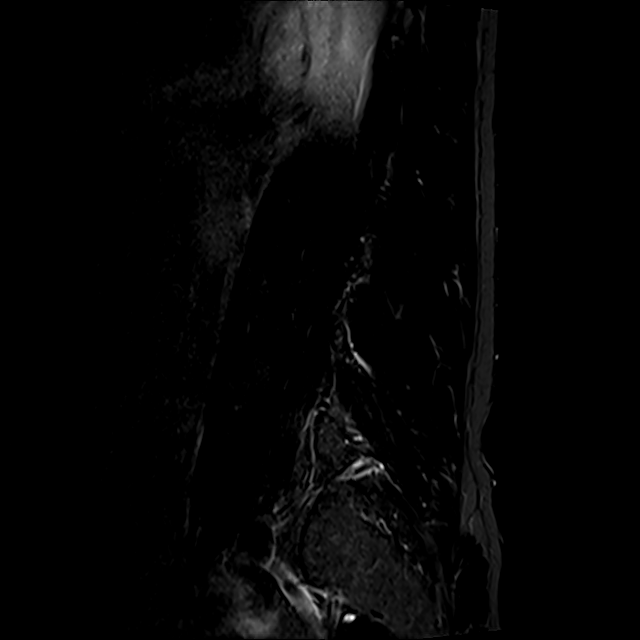
[im 4/17]
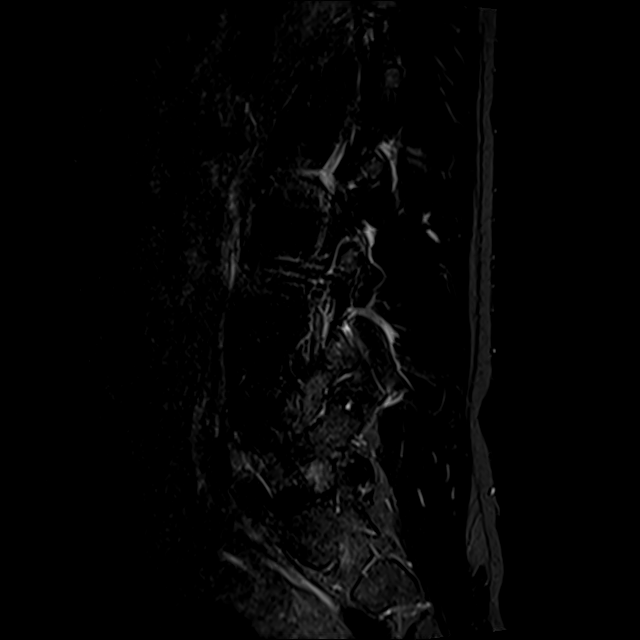

[Series 8: T2 · axial · 4.0mm · 0.78mm/px · z∈[-115,+109]mm · 9 of 40 slices shown (2 of 2)]
[im 1/40]
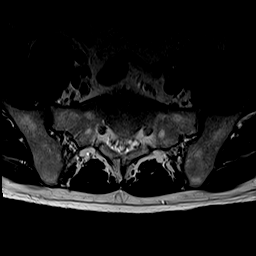
[im 6/40]
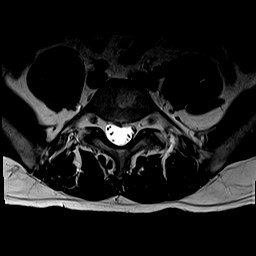
[im 12/40]
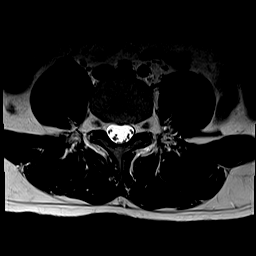
[im 17/40]
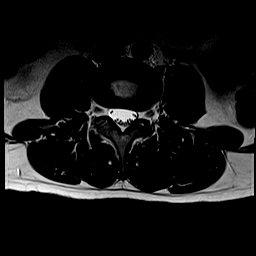
[im 20/40]
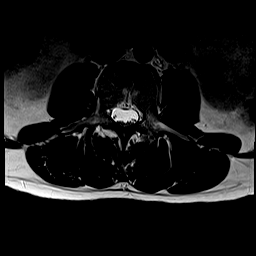
[im 23/40]
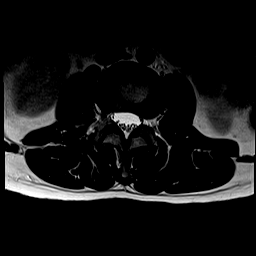
[im 28/40]
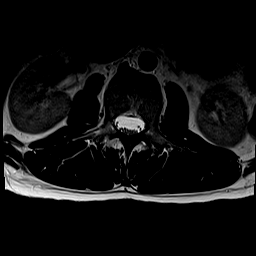
[im 34/40]
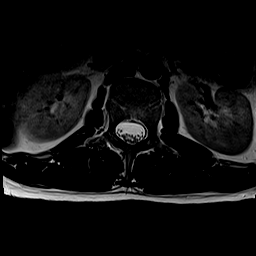
[im 40/40]
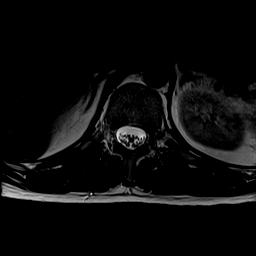

[Series 9: T1 · axial · 4.0mm · 0.39mm/px · z∈[-115,+109]mm · 9 of 40 slices shown (2 of 2)]
[im 1/40]
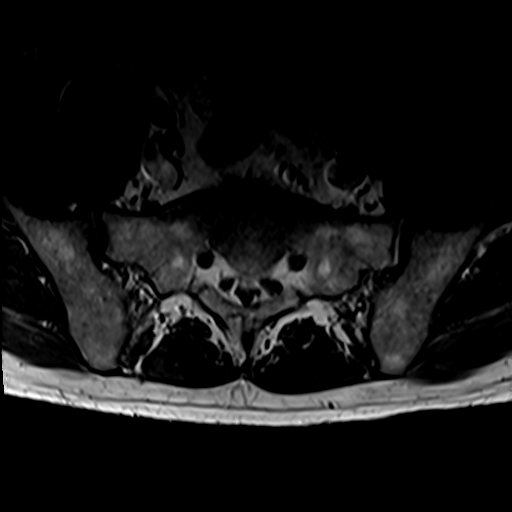
[im 6/40]
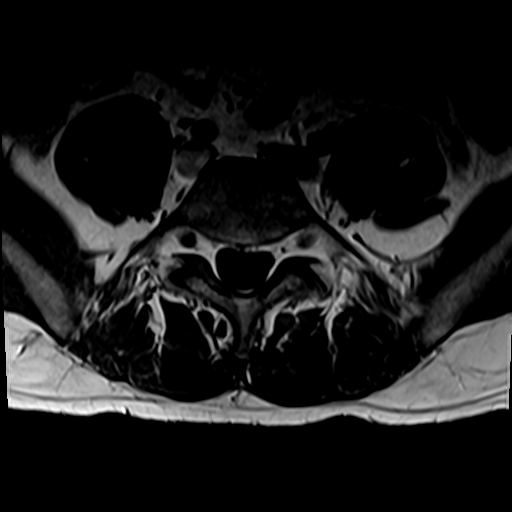
[im 12/40]
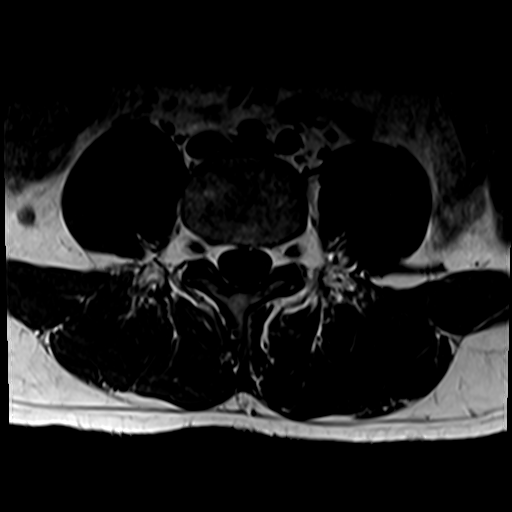
[im 17/40]
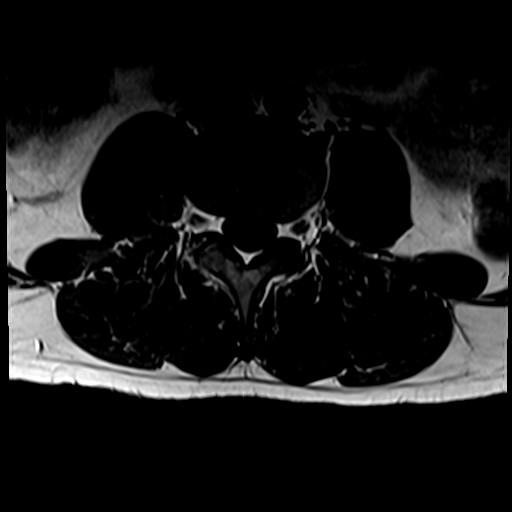
[im 20/40]
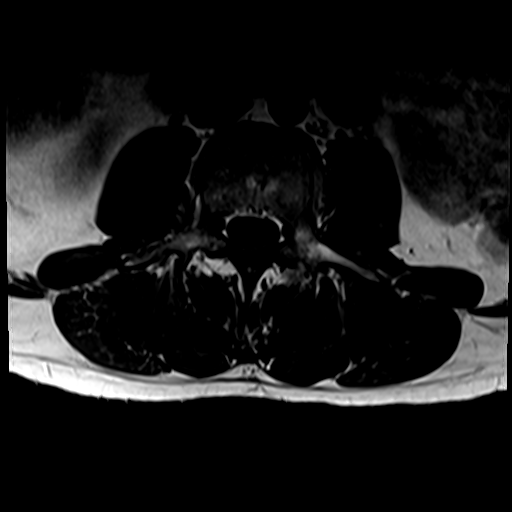
[im 23/40]
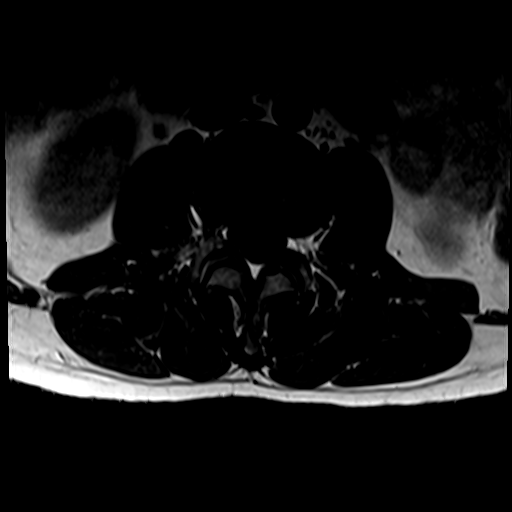
[im 28/40]
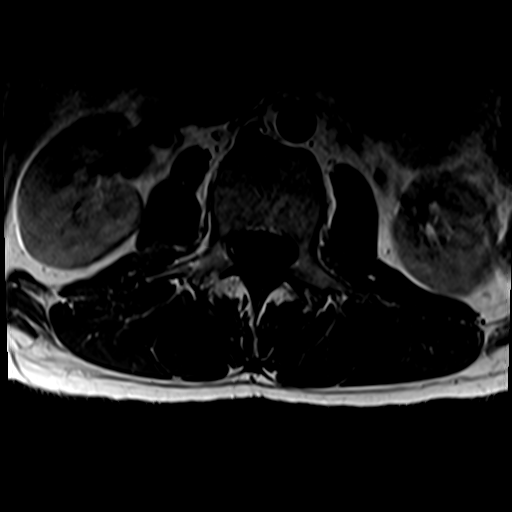
[im 34/40]
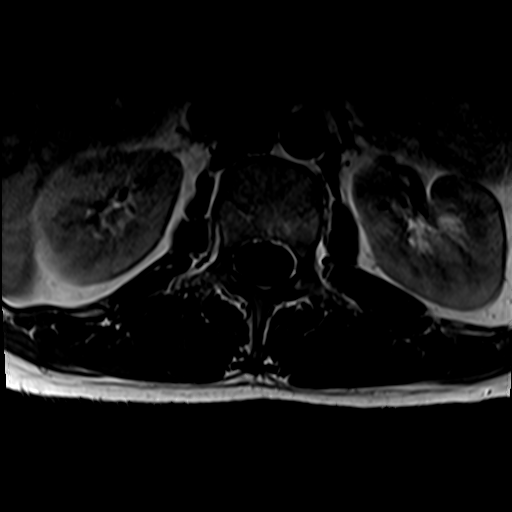
[im 40/40]
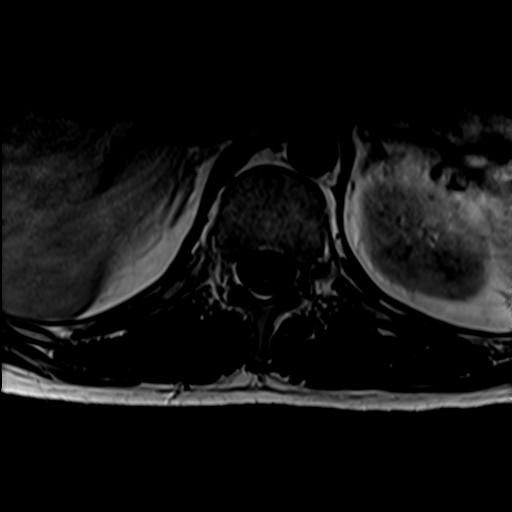

[32 of 48 positions shown; findings below may reference images not displayed]

FINDINGS: Segmentation:  Standard lumbar numbering

Alignment:  Mild levocurvature by radiography.  No listhesis

Vertebrae:  No fracture, evidence of discitis, or bone lesion.

Conus medullaris and cauda equina: Conus extends to the T12-L1
level. Conus and cauda equina appear normal.

Paraspinal and other soft tissues: Negative

Disc levels:

L5-S1 disc desiccation and narrowing with central protrusion not
contacting nerve roots. The other levels are normal.
IMPRESSION: 1. L5-S1 disc degeneration with small noncompressive protrusion.
2. No explanation for extremity symptoms.
